# Patient Record
Sex: Female | Born: 1980 | Race: Black or African American | Hispanic: No | Marital: Single | Smoking: Never smoker
Health system: Southern US, Community
[De-identification: ages and names within clinical notes are randomized; demographics above are authoritative.]

---

## 2010-06-03 NOTE — L&D Delivery Note (Signed)
Delivery Note At 3:21 PM a viable female was delivered via Vaginal, Spontaneous Delivery (Presentation: ;  ).  APGAR: 9, 9; weight .   Placenta status: Intact, Spontaneous.  Cord: 3 vessels with the following complications: .  Cord pH: not done  Anesthesia: None  Episiotomy: None Lacerations: 2nd degree Suture Repair: 2.0 vicryl Est. Blood Loss (mL): 250  Mom to postpartum.  Baby to nursery-stable.  MARSHALL,BERNARD A 06/01/2011, 3:53 PM

## 2011-06-01 ENCOUNTER — Encounter (HOSPITAL_COMMUNITY): Payer: Self-pay | Admitting: *Deleted

## 2011-06-01 ENCOUNTER — Inpatient Hospital Stay (HOSPITAL_COMMUNITY)
Admission: AD | Admit: 2011-06-01 | Discharge: 2011-06-03 | DRG: 775 | Disposition: A | Discharge status: Home / Self Care | Payer: Medicaid Other | Source: Ambulatory Visit | Attending: Obstetrics | Admitting: Obstetrics

## 2011-06-01 MED ORDER — DIPHENHYDRAMINE HCL 25 MG PO CAPS: 25.0000 mg | ORAL_CAPSULE | Freq: Four times a day (QID) | ORAL | Status: DC | PRN

## 2011-06-01 MED ORDER — ERYTHROMYCIN 5 MG/GM OP OINT
TOPICAL_OINTMENT | OPHTHALMIC | Status: AC
  Filled 2011-06-01: qty 1

## 2011-06-01 MED ORDER — LANOLIN HYDROUS EX OINT: TOPICAL_OINTMENT | CUTANEOUS | Status: DC | PRN

## 2011-06-01 MED ORDER — TETANUS-DIPHTH-ACELL PERTUSSIS 5-2.5-18.5 LF-MCG/0.5 IM SUSP
0.5000 mL | Freq: Once | INTRAMUSCULAR | Status: AC
  Administered 2011-06-03: 0.5 mL via INTRAMUSCULAR
  Filled 2011-06-01: qty 0.5

## 2011-06-01 MED ORDER — IBUPROFEN 600 MG PO TABS
600.0000 mg | ORAL_TABLET | Freq: Four times a day (QID) | ORAL | Status: DC
  Administered 2011-06-01 – 2011-06-03 (×7): 600 mg via ORAL
  Filled 2011-06-01 (×7): qty 1

## 2011-06-01 MED ORDER — ONDANSETRON HCL 4 MG PO TABS: 4.0000 mg | ORAL_TABLET | ORAL | Status: DC | PRN

## 2011-06-01 MED ORDER — WITCH HAZEL-GLYCERIN EX PADS: 1.0000 "application " | MEDICATED_PAD | CUTANEOUS | Status: DC | PRN

## 2011-06-01 MED ORDER — LIDOCAINE HCL (PF) 1 % IJ SOLN
INTRAMUSCULAR | Status: AC
  Filled 2011-06-01: qty 30

## 2011-06-01 MED ORDER — PRENATAL MULTIVITAMIN CH
1.0000 | ORAL_TABLET | Freq: Every day | ORAL | Status: DC
  Administered 2011-06-02 – 2011-06-03 (×2): 1 via ORAL
  Filled 2011-06-01 (×2): qty 1

## 2011-06-01 MED ORDER — SENNOSIDES-DOCUSATE SODIUM 8.6-50 MG PO TABS
2.0000 | ORAL_TABLET | Freq: Every day | ORAL | Status: DC
  Administered 2011-06-01 – 2011-06-02 (×2): 2 via ORAL

## 2011-06-01 MED ORDER — BENZOCAINE-MENTHOL 20-0.5 % EX AERO: 1.0000 "application " | INHALATION_SPRAY | CUTANEOUS | Status: DC | PRN

## 2011-06-01 MED ORDER — FERROUS SULFATE 325 (65 FE) MG PO TABS
325.0000 mg | ORAL_TABLET | Freq: Two times a day (BID) | ORAL | Status: DC
  Administered 2011-06-02 – 2011-06-03 (×3): 325 mg via ORAL
  Filled 2011-06-01 (×3): qty 1

## 2011-06-01 MED ORDER — DIBUCAINE 1 % RE OINT: 1.0000 "application " | TOPICAL_OINTMENT | RECTAL | Status: DC | PRN

## 2011-06-01 MED ORDER — ZOLPIDEM TARTRATE 5 MG PO TABS: 5.0000 mg | ORAL_TABLET | Freq: Every evening | ORAL | Status: DC | PRN

## 2011-06-01 MED ORDER — OXYCODONE-ACETAMINOPHEN 5-325 MG PO TABS
1.0000 | ORAL_TABLET | ORAL | Status: DC | PRN
  Administered 2011-06-01: 1 via ORAL
  Administered 2011-06-01 – 2011-06-03 (×4): 2 via ORAL
  Filled 2011-06-01 (×2): qty 2
  Filled 2011-06-01: qty 1
  Filled 2011-06-01: qty 2
  Filled 2011-06-01: qty 1
  Filled 2011-06-01: qty 2

## 2011-06-01 MED ORDER — ONDANSETRON HCL 4 MG/2ML IJ SOLN: 4.0000 mg | INTRAMUSCULAR | Status: DC | PRN

## 2011-06-01 MED ORDER — SIMETHICONE 80 MG PO CHEW: 80.0000 mg | CHEWABLE_TABLET | ORAL | Status: DC | PRN

## 2011-06-01 MED ORDER — OXYTOCIN 10 UNIT/ML IJ SOLN
INTRAMUSCULAR | Status: AC
  Administered 2011-06-01: 20 [IU]
  Filled 2011-06-01: qty 2

## 2011-06-01 NOTE — H&P (Signed)
This is Dr. 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

## 2011-06-01 NOTE — Progress Notes (Signed)
Pt very uncomforatable. 3 baby. Thinks water broke. Placed on monitor  SVE complete+1

## 2011-06-02 ENCOUNTER — Encounter (HOSPITAL_COMMUNITY): Payer: Self-pay | Admitting: *Deleted

## 2011-06-02 LAB — CBC
HCT: 30.8 % — ABNORMAL LOW (ref 36.0–46.0)
MCV: 83.2 fL (ref 78.0–100.0)
Platelets: 107 10*3/uL — ABNORMAL LOW (ref 150–400)
RBC: 3.7 MIL/uL — ABNORMAL LOW (ref 3.87–5.11)
WBC: 11.4 10*3/uL — ABNORMAL HIGH (ref 4.0–10.5)

## 2011-06-02 NOTE — Progress Notes (Signed)
Patient ID: Jeanne Willis, female   DOB: [birth year], 30 y.o.   MRN: [id number] Postpartum day one Vital signs normal Fundus firm Lochia moderate Legs negative No complaints

## 2011-06-03 MED ORDER — INFLUENZA VIRUS VACC SPLIT PF IM SUSP
0.5000 mL | INTRAMUSCULAR | Status: AC | PRN
  Administered 2011-06-03: 0.5 mL via INTRAMUSCULAR
  Filled 2011-06-03: qty 0.5

## 2011-06-03 NOTE — Discharge Summary (Signed)
Obstetric Discharge Summary Reason for Admission: onset of labor Prenatal Procedures: none Intrapartum Procedures: spontaneous vaginal delivery Postpartum Procedures: none Complications-Operative and Postpartum: none Hemoglobin  Date Value Range Status  06/02/2011 10.3* 12.0-15.0 (g/dL) Final     HCT  Date Value Range Status  06/02/2011 30.8* 36.0-46.0 (%) Final    Discharge Diagnoses: Term Pregnancy-delivered  Discharge Information: Date: 06/03/2011 Activity: pelvic rest Diet: routine Medications: Percocet Condition: stable Instructions: refer to practice specific booklet Discharge to: home Follow-up Information    Follow up with MARSHALL,BERNARD A, MD. Call in 6 weeks.   Contact information:   [address] [place] [address] [phone]          Newborn Data: Live born female  Birth Weight: 8 lb 11 oz (3940 g) APGAR: 9, 9  Home with mother.  MARSHALL,BERNARD A 06/03/2011, 6:37 AM

## 2011-06-03 NOTE — Progress Notes (Signed)
UR chart review completed.  

## 2011-08-07 ENCOUNTER — Ambulatory Visit
Admission: RE | Admit: 2011-08-07 | Discharge: 2011-08-07 | Disposition: A | Discharge status: Home / Self Care | Payer: Medicaid Other | Source: Ambulatory Visit | Attending: Specialist | Admitting: Specialist

## 2011-08-07 ENCOUNTER — Other Ambulatory Visit: Payer: Self-pay | Admitting: Specialist

## 2011-08-07 DIAGNOSIS — R7611 Nonspecific reaction to tuberculin skin test without active tuberculosis: Secondary | ICD-10-CM

## 2014-04-04 ENCOUNTER — Encounter (HOSPITAL_COMMUNITY): Payer: Self-pay | Admitting: *Deleted

## 2017-07-02 ENCOUNTER — Emergency Department (HOSPITAL_COMMUNITY): Payer: Medicaid Other

## 2017-07-02 ENCOUNTER — Other Ambulatory Visit: Payer: Self-pay

## 2017-07-02 ENCOUNTER — Emergency Department (HOSPITAL_COMMUNITY)
Admission: EM | Admit: 2017-07-02 | Discharge: 2017-07-02 | Disposition: A | Discharge status: Home / Self Care | Payer: Medicaid Other | Attending: Physician Assistant | Admitting: Physician Assistant

## 2017-07-02 DIAGNOSIS — W25XXXA Contact with sharp glass, initial encounter: Secondary | ICD-10-CM | POA: Diagnosis not present

## 2017-07-02 DIAGNOSIS — Z23 Encounter for immunization: Secondary | ICD-10-CM | POA: Insufficient documentation

## 2017-07-02 DIAGNOSIS — S61411A Laceration without foreign body of right hand, initial encounter: Secondary | ICD-10-CM | POA: Diagnosis not present

## 2017-07-02 DIAGNOSIS — Y92000 Kitchen of unspecified non-institutional (private) residence as  the place of occurrence of the external cause: Secondary | ICD-10-CM | POA: Insufficient documentation

## 2017-07-02 DIAGNOSIS — Y998 Other external cause status: Secondary | ICD-10-CM | POA: Insufficient documentation

## 2017-07-02 DIAGNOSIS — S6991XA Unspecified injury of right wrist, hand and finger(s), initial encounter: Secondary | ICD-10-CM | POA: Diagnosis present

## 2017-07-02 DIAGNOSIS — Y93G1 Activity, food preparation and clean up: Secondary | ICD-10-CM | POA: Diagnosis not present

## 2017-07-02 MED ORDER — OXYCODONE-ACETAMINOPHEN 5-325 MG PO TABS
1.0000 | ORAL_TABLET | Freq: Once | ORAL | Status: AC
  Administered 2017-07-02: 1 via ORAL
  Filled 2017-07-02: qty 1

## 2017-07-02 MED ORDER — LIDOCAINE HCL (PF) 1 % IJ SOLN
5.0000 mL | Freq: Once | INTRAMUSCULAR | Status: AC
  Administered 2017-07-02: 5 mL via INTRADERMAL
  Filled 2017-07-02: qty 5

## 2017-07-02 MED ORDER — TETANUS-DIPHTH-ACELL PERTUSSIS 5-2.5-18.5 LF-MCG/0.5 IM SUSP
0.5000 mL | Freq: Once | INTRAMUSCULAR | Status: AC
  Administered 2017-07-02: 0.5 mL via INTRAMUSCULAR
  Filled 2017-07-02: qty 0.5

## 2017-07-02 NOTE — ED Notes (Signed)
Will be discharged right after splint applied.

## 2017-07-02 NOTE — Discharge Instructions (Signed)
He was seen with laceration to her right hand.  It was very extensive.  We were able to repair it.  But we want you to wear the splint to help that heal.  Please remove sutures in 10-14 days.  You may need to follow-up with a hand surgeon if you have any weakness or any symptoms such as an infection or other concerns.

## 2017-07-02 NOTE — ED Triage Notes (Addendum)
Pt was walking outside caring a plate and slipped on ice and cut her right hand right under right thumb on the thumb pad of palm.Pt has deep laceration , controlled bleeding at this time. Pt able to move all digits.

## 2017-07-02 NOTE — ED Notes (Signed)
Ortho tech paged to placed splint on right hand to allow sutures to stay in place.

## 2017-07-02 NOTE — ED Provider Notes (Signed)
[facility] Provider Note   CSN: [id number] Arrival date & time: 07/02/17  [id number]     History   Chief Complaint No chief complaint on file.   HPI Jeanne Willis is a 37 y.o. female.  HPI   Patient is a 37 year old female presenting after laceration to her right hand.  Patient reports that she was washing dishes she had a plate in her hand she fell onto the floor and the plate caught her right hand.  Patient has a laceration sustained to the thenar eminence.  No past medical history on file.  There are no active problems to display for this patient.     OB History    Gravida Para Term Preterm AB Living   2 1       1    SAB TAB Ectopic Multiple Live Births           1       Home Medications    Prior to Admission medications   Not on File    Family History No family history on file.  Social History Social History   Tobacco Use  . Smoking status: Not on file  Substance Use Topics  . Alcohol use: Not on file  . Drug use: Not on file     Allergies   Patient has no known allergies.   Review of Systems Review of Systems  Constitutional: Negative for activity change.  Respiratory: Negative for shortness of breath.   Cardiovascular: Negative for chest pain.  Gastrointestinal: Negative for abdominal pain.  All other systems reviewed and are negative.    Physical Exam Updated Vital Signs BP 121/84 (BP Location: Left Arm)   Pulse 75   Temp 98.6 F (37 C) (Oral)   Resp 16   SpO2 100%   Physical Exam  Constitutional: She is oriented to person, place, and time. She appears well-developed and well-nourished.  HENT:  Head: Normocephalic and atraumatic.  Eyes: Right eye exhibits no discharge.  Cardiovascular: Normal rate, regular rhythm and normal heart sounds.  No murmur heard. Pulmonary/Chest: Effort normal.  Musculoskeletal:  Laceration to right hand. >6 cm to thenar emenence.  Thumb strnegth normal, Thumb  to pinky strength normal.   Neurological: She is oriented to person, place, and time.  Skin: Skin is warm and dry. She is not diaphoretic.  Psychiatric: She has a normal mood and affect.  Nursing note and vitals reviewed.    ED Treatments / Results  Labs (all labs ordered are listed, but only abnormal results are displayed) Labs Reviewed - No data to display  EKG  EKG Interpretation None       Radiology No results found.  Procedures .Marland Kitchen.Laceration Repair Date/Time: 07/02/2017 10:41 AM Performed by: Abelino DerrickMackuen, Tavaria Mackins Lyn, MD Authorized by: Abelino DerrickMackuen, Fusako Tanabe Lyn, MD   Consent:    Consent obtained:  Verbal Anesthesia (see MAR for exact dosages):    Anesthesia method:  Local infiltration   Local anesthetic:  Lidocaine 1% WITH epi Laceration details:    Location:  Hand   Hand location:  R palm   Length (cm):  8   Laceration depth: 20. Repair type:    Repair type:  Complex Pre-procedure details:    Preparation:  Patient was prepped and draped in usual sterile fashion Exploration:    Limited defect created (wound extended): no     Wound extent: muscle damage     Wound extent: no nerve damage noted, no tendon damage noted, no  underlying fracture noted and no vascular damage noted     Contaminated: no   Treatment:    Area cleansed with:  Betadine   Amount of cleaning:  Extensive   Irrigation solution:  Sterile saline   Irrigation method:  Pressure wash and syringe   Visualized foreign bodies/material removed: no     Debridement:  None Subcutaneous repair:    Suture size:  4-0   Suture material:  Vicryl   Suture technique:  Simple interrupted   Number of sutures:  4 Skin repair:    Repair method:  Sutures   Suture size:  3-0   Suture material:  Nylon   Suture technique:  Simple interrupted   Number of sutures:  5 Approximation:    Approximation:  Close   Vermilion border: well-aligned   Post-procedure details:    Dressing:  Antibiotic ointment, bulky  dressing and splint for protection   Patient tolerance of procedure:  Tolerated well, no immediate complications   (including critical care time)  SPLINT APPLICATION Date/Time: 10:43 AM Authorized by: Arlana Hove Consent: Verbal consent obtained. Risks and benefits: risks, benefits and alternatives were discussed Consent given by: patient Splint applied ZO:[id number] technician Location details: r wrist Post-procedure: The splinted body part was neurovascularly unchanged following the procedure. Patient tolerance: Patient tolerated the procedure well with no immediate complications.      Medications Ordered in ED Medications  Tdap (BOOSTRIX) injection 0.5 mL (not administered)  oxyCODONE-acetaminophen (PERCOCET/ROXICET) 5-325 MG per tablet 1 tablet (not administered)  lidocaine (PF) (XYLOCAINE) 1 % injection 5 mL (not administered)     Initial Impression / Assessment and Plan / ED Course  I have reviewed the triage vital signs and the nursing notes.  Pertinent labs & imaging results that were available during my care of the patient were reviewed by me and considered in my medical decision making (see chart for details).     Patient is a 37 year old female presenting after laceration to her right hand.  Patient reports that she was washing dishes she had a plate in her hand she fell onto the floor and the plate caught her right hand.  Patient has a laceration sustained to the thenar eminence.   9:09 AM Will get x-ray to rule out retained piece of glass.  Will update tetanus.  Plan on appearing and will splint patient to maximize healing.  Laceration sutured after extensive cleaning.  Splint applied  Final Clinical Impressions(s) / ED Diagnoses   Final diagnoses:  None    ED Discharge Orders    None       Mccall Will, Cindee Salt, MD 07/02/17 1044

## 2017-07-02 NOTE — Progress Notes (Signed)
Orthopedic Tech Progress Note Patient Details:  [name] 1981/01/27 [id number]  Ortho Devices Type of Ortho Device: Ace wrap, Lenora BoysWatson Jones splint Ortho Device/Splint Location: rue Ortho Device/Splint Interventions: Application   Post Interventions Patient Tolerated: Well Instructions Provided: Care of device   [name] 07/02/2017, 11:08 AM

## 2017-07-15 ENCOUNTER — Emergency Department (HOSPITAL_COMMUNITY)
Admission: EM | Admit: 2017-07-15 | Discharge: 2017-07-15 | Disposition: A | Discharge status: Home / Self Care | Payer: Medicaid Other | Attending: Emergency Medicine | Admitting: Emergency Medicine

## 2017-07-15 ENCOUNTER — Other Ambulatory Visit: Payer: Self-pay

## 2017-07-15 DIAGNOSIS — Z4802 Encounter for removal of sutures: Secondary | ICD-10-CM

## 2017-07-15 DIAGNOSIS — S61411D Laceration without foreign body of right hand, subsequent encounter: Secondary | ICD-10-CM | POA: Diagnosis not present

## 2017-07-15 DIAGNOSIS — Y33XXXD Other specified events, undetermined intent, subsequent encounter: Secondary | ICD-10-CM | POA: Diagnosis not present

## 2017-07-15 NOTE — ED Provider Notes (Signed)
[facility]   CSN: [id number] Arrival date & time: 07/15/17  1140     History   Chief Complaint Chief Complaint  Patient presents with  . Suture / Staple Removal    HPI Jeanne Willis is a 37 y.o. female.  HPI   37 year old female presents today for suture removal.  She suffered a laceration to the right thenar eminence on 07/02/2017.  She notes she is here for suture removal, denies any redness swelling or discharge, but no significant complications.  No past medical history on file.  There are no active problems to display for this patient.    OB History    Gravida Para Term Preterm AB Living   2 1       1    SAB TAB Ectopic Multiple Live Births           1      Home Medications    Prior to Admission medications   Not on File    Family History No family history on file.  Social History Social History   Tobacco Use  . Smoking status: Not on file  Substance Use Topics  . Alcohol use: Not on file  . Drug use: Not on file     Allergies   Patient has no known allergies.   Review of Systems Review of Systems  All other systems reviewed and are negative.    Physical Exam Updated Vital Signs BP 120/80 (BP Location: Right Arm)   Pulse 71   Temp 98.5 F (36.9 C) (Oral)   Resp 16   SpO2 100%   Physical Exam  Constitutional: She is oriented to person, place, and time. She appears well-developed and well-nourished.  HENT:  Head: Normocephalic and atraumatic.  Eyes: Conjunctivae are normal. Pupils are equal, round, and reactive to light. Right eye exhibits no discharge. Left eye exhibits no discharge. No scleral icterus.  Neck: Normal range of motion. No JVD present. No tracheal deviation present.  Pulmonary/Chest: Effort normal. No stridor.  Musculoskeletal:  Healed 6 inch laceration to the right thenar eminence no surrounding redness discharge or swelling  Neurological: She is alert and  oriented to person, place, and time. Coordination normal.  Psychiatric: She has a normal mood and affect. Her behavior is normal. Judgment and thought content normal.  Nursing note and vitals reviewed.    ED Treatments / Results  Labs (all labs ordered are listed, but only abnormal results are displayed) Labs Reviewed - No data to display  EKG  EKG Interpretation None       Radiology No results found.  Procedures .Suture Removal Date/Time: 07/15/2017 12:06 PM Performed by: Eyvonne MechanicHedges, Benjamim Harnish, PA-C Authorized by: Eyvonne MechanicHedges, Winifred Bodiford, PA-C   Consent:    Consent obtained:  Verbal   Consent given by:  Patient   Risks discussed:  Pain and bleeding   Alternatives discussed:  Delayed treatment, no treatment and alternative treatment Location:    Location:  Upper extremity   Upper extremity location:  Hand   Hand location:  R hand Procedure details:    Wound appearance:  No signs of infection, clean and good wound healing   Number of sutures removed:  5 Post-procedure details:    Post-removal:  Band-Aid applied   Patient tolerance of procedure:  Tolerated well, no immediate complications   (including critical care time)    Medications Ordered in ED Medications - No data to display   Initial Impression / Assessment and  Plan / ED Course  I have reviewed the triage vital signs and the nursing notes.  Pertinent labs & imaging results that were available during my care of the patient were reviewed by me and considered in my medical decision making (see chart for details).     Final Clinical Impressions(s) / ED Diagnoses   Final diagnoses:  Visit for suture removal    Labs:   Imaging:  Consults:  Therapeutics:  Discharge Meds:   Assessment/Plan: Patient here for suture removal no complications return precautions given.    ED Discharge Orders    None       Rosalio Loud 07/15/17 1209    Gwyneth Sprout, MD 07/15/17 [phone]

## 2017-07-15 NOTE — ED Notes (Signed)
Declined W/C at D/C and was escorted to lobby by RN. 

## 2017-07-15 NOTE — ED Triage Notes (Signed)
Pt here to have sutures on right hand removed.

## 2017-07-15 NOTE — Discharge Instructions (Signed)
Please read attached information. If you experience any new or worsening signs or symptoms please return to the emergency room for evaluation.  °

## 2021-05-13 ENCOUNTER — Other Ambulatory Visit: Payer: Self-pay

## 2021-05-13 ENCOUNTER — Emergency Department (HOSPITAL_COMMUNITY): Payer: Medicaid Other

## 2021-05-13 ENCOUNTER — Emergency Department (HOSPITAL_COMMUNITY)
Admission: EM | Admit: 2021-05-13 | Discharge: 2021-05-13 | Disposition: A | Discharge status: Home / Self Care | Payer: Medicaid Other | Attending: Emergency Medicine | Admitting: Emergency Medicine

## 2021-05-13 ENCOUNTER — Encounter (HOSPITAL_COMMUNITY): Payer: Self-pay | Admitting: Emergency Medicine

## 2021-05-13 DIAGNOSIS — B349 Viral infection, unspecified: Secondary | ICD-10-CM

## 2021-05-13 DIAGNOSIS — R1031 Right lower quadrant pain: Secondary | ICD-10-CM | POA: Insufficient documentation

## 2021-05-13 DIAGNOSIS — Z20822 Contact with and (suspected) exposure to covid-19: Secondary | ICD-10-CM | POA: Diagnosis not present

## 2021-05-13 DIAGNOSIS — R1030 Lower abdominal pain, unspecified: Secondary | ICD-10-CM

## 2021-05-13 DIAGNOSIS — R110 Nausea: Secondary | ICD-10-CM | POA: Diagnosis not present

## 2021-05-13 DIAGNOSIS — R197 Diarrhea, unspecified: Secondary | ICD-10-CM | POA: Insufficient documentation

## 2021-05-13 LAB — CBC WITH DIFFERENTIAL/PLATELET
Abs Immature Granulocytes: 0.01 10*3/uL (ref 0.00–0.07)
Basophils Absolute: 0 10*3/uL (ref 0.0–0.1)
Basophils Relative: 1 %
Eosinophils Absolute: 0 10*3/uL (ref 0.0–0.5)
Eosinophils Relative: 1 %
HCT: 43 % (ref 36.0–46.0)
Hemoglobin: 14 g/dL (ref 12.0–15.0)
Immature Granulocytes: 0 %
Lymphocytes Relative: 47 %
Lymphs Abs: 2 10*3/uL (ref 0.7–4.0)
MCH: 28.6 pg (ref 26.0–34.0)
MCHC: 32.6 g/dL (ref 30.0–36.0)
MCV: 87.8 fL (ref 80.0–100.0)
Monocytes Absolute: 0.3 10*3/uL (ref 0.1–1.0)
Monocytes Relative: 7 %
Neutro Abs: 1.8 10*3/uL (ref 1.7–7.7)
Neutrophils Relative %: 44 %
Platelets: 166 10*3/uL (ref 150–400)
RBC: 4.9 MIL/uL (ref 3.87–5.11)
RDW: 12.4 % (ref 11.5–15.5)
WBC: 4.2 10*3/uL (ref 4.0–10.5)
nRBC: 0 % (ref 0.0–0.2)

## 2021-05-13 LAB — RESP PANEL BY RT-PCR (FLU A&B, COVID) ARPGX2
Influenza A by PCR: NEGATIVE
Influenza B by PCR: NEGATIVE
SARS Coronavirus 2 by RT PCR: NEGATIVE

## 2021-05-13 LAB — COMPREHENSIVE METABOLIC PANEL
ALT: 18 U/L (ref 0–44)
AST: 21 U/L (ref 15–41)
Albumin: 3.6 g/dL (ref 3.5–5.0)
Alkaline Phosphatase: 52 U/L (ref 38–126)
Anion gap: 5 (ref 5–15)
BUN: 6 mg/dL (ref 6–20)
CO2: 25 mmol/L (ref 22–32)
Calcium: 9 mg/dL (ref 8.9–10.3)
Chloride: 105 mmol/L (ref 98–111)
Creatinine, Ser: 0.8 mg/dL (ref 0.44–1.00)
GFR, Estimated: >60 mL/min (ref >60)
Glucose, Bld: 91 mg/dL (ref 70–99)
Potassium: 4 mmol/L (ref 3.5–5.1)
Sodium: 135 mmol/L (ref 135–145)
Total Bilirubin: 0.7 mg/dL (ref 0.3–1.2)
Total Protein: 7 g/dL (ref 6.5–8.1)

## 2021-05-13 LAB — I-STAT BETA HCG BLOOD, ED (MC, WL, AP ONLY): I-stat hCG, quantitative: <5 m[IU]/mL (ref <5)

## 2021-05-13 LAB — URINALYSIS, ROUTINE W REFLEX MICROSCOPIC
Bilirubin Urine: NEGATIVE
Glucose, UA: NEGATIVE mg/dL
Hgb urine dipstick: NEGATIVE
Ketones, ur: NEGATIVE mg/dL
Leukocytes,Ua: NEGATIVE
Nitrite: NEGATIVE
Protein, ur: NEGATIVE mg/dL
Specific Gravity, Urine: 1.018 (ref 1.005–1.030)
pH: 7 (ref 5.0–8.0)

## 2021-05-13 LAB — GROUP A STREP BY PCR: Group A Strep by PCR: NOT DETECTED

## 2021-05-13 LAB — LIPASE, BLOOD: Lipase: 27 U/L (ref 11–51)

## 2021-05-13 MED ORDER — ONDANSETRON HCL 4 MG/2ML IJ SOLN
4.0000 mg | Freq: Once | INTRAMUSCULAR | Status: AC
  Administered 2021-05-13: 4 mg via INTRAVENOUS
  Filled 2021-05-13: qty 2

## 2021-05-13 MED ORDER — IOHEXOL 300 MG/ML  SOLN
100.0000 mL | Freq: Once | INTRAMUSCULAR | Status: AC | PRN
  Administered 2021-05-13: 100 mL via INTRAVENOUS

## 2021-05-13 MED ORDER — DICYCLOMINE HCL 20 MG PO TABS: 20.0000 mg | ORAL_TABLET | Freq: Two times a day (BID) | ORAL | 0 refills | Status: AC | PRN

## 2021-05-13 MED ORDER — ONDANSETRON 4 MG PO TBDP: 4.0000 mg | ORAL_TABLET | Freq: Three times a day (TID) | ORAL | 0 refills | Status: AC | PRN

## 2021-05-13 NOTE — ED Provider Notes (Signed)
[facility] EMERGENCY DEPARTMENT Provider Note  CSN: [id number] Arrival date & time: 05/13/21 1141    History No chief complaint on file.   Blyss Lugar is a 40 y.o. female reports two days of abdominal cramping, nausea without vomiting, occasional diarrhea, sore throat and chills but no fever. She denies any urinary symptoms.    History reviewed. No pertinent past medical history.  History reviewed. No pertinent surgical history.  No family history on file.  Social History   Tobacco Use  . Smoking status: Never  . Smokeless tobacco: Never  Substance Use Topics  . Alcohol use: Not Currently  . Drug use: Not Currently     Home Medications Prior to Admission medications   Not on File     Allergies    Patient has no known allergies.   Review of Systems   Review of Systems A comprehensive review of systems was completed and negative except as noted in HPI.    Physical Exam BP 131/90   Pulse 67   Temp 97.9 F (36.6 C) (Oral)   Resp 16   LMP 05/03/2021   SpO2 100%   Physical Exam Vitals and nursing note reviewed.  Constitutional:      Appearance: Normal appearance.  HENT:     Head: Normocephalic and atraumatic.     Nose: Nose normal.     Mouth/Throat:     Mouth: Mucous membranes are moist.  Eyes:     Extraocular Movements: Extraocular movements intact.     Conjunctiva/sclera: Conjunctivae normal.  Cardiovascular:     Rate and Rhythm: Normal rate.  Pulmonary:     Effort: Pulmonary effort is normal.     Breath sounds: Normal breath sounds.  Abdominal:     General: Abdomen is flat.     Palpations: Abdomen is soft.     Tenderness: There is abdominal tenderness (RLQ). There is no guarding or rebound.  Musculoskeletal:        General: No swelling. Normal range of motion.     Cervical back: Neck supple.  Skin:    General: Skin is warm and dry.  Neurological:     General: No focal deficit present.     Mental Status: She is alert.  Psychiatric:         Mood and Affect: Mood normal.     ED Results / Procedures / Treatments   Labs (all labs ordered are listed, but only abnormal results are displayed) Labs Reviewed  GROUP A STREP BY PCR  RESP PANEL BY RT-PCR (FLU A&B, COVID) ARPGX2  URINALYSIS, ROUTINE W REFLEX MICROSCOPIC  COMPREHENSIVE METABOLIC PANEL  LIPASE, BLOOD  CBC WITH DIFFERENTIAL/PLATELET  I-STAT BETA HCG BLOOD, ED ([facility], [facility], [facility] ONLY)    EKG None  Radiology No results found.  Procedures Procedures  Medications Ordered in the ED Medications - No data to display   MDM Rules/Calculators/A&P MDM Patient here with nonspecific abdominal pains, but focally tender in RLQ. No peritoneal signs now. Will check CT. Labs done in triage are otherwise normal including CBC, CMP, strep, Covid tests. Zofran for nausea.   ED Course  I have reviewed the triage vital signs and the nursing notes.  Pertinent labs & imaging results that were available during my care of the patient were reviewed by me and considered in my medical decision making (see chart for details).  Clinical Course as of 05/13/21 1947  Wynelle Link May 13, 2021  1802 CT images and results reviewed, no definite appendicitis, small  amount of fluid at the tip of the cecum is nonspecific. Given normal WBC and no peritoneal signs will plan discharge with Rx for zofran for nausea and strict return precautions if pain worsens.  [CS]    Clinical Course User Index [CS] Pollyann Savoy, MD    Final Clinical Impression(s) / ED Diagnoses Final diagnoses:  None    Rx / DC Orders ED Discharge Orders     None        Pollyann Savoy, MD 05/13/21 1947

## 2021-05-13 NOTE — ED Provider Notes (Signed)
Emergency Medicine Provider Triage Evaluation Note  [facility] , a 40 y.o. female  was evaluated in triage.  Pt complains of abdominal pain, nausea and sore throat.  She states that symptoms began yesterday with abdominal cramping.  She states that she feels uneasy on her stomach and nauseated however has not had any vomiting.  She does endorse diarrhea.  She denies fevers.  She states that additionally she is having pain with swallowing and cold chills.  Denies sick contacts.  Review of Systems  Positive: See above Negative:   Physical Exam  BP 131/90   Pulse 67   Temp 97.9 F (36.6 C) (Oral)   Resp 16   SpO2 100%  Gen:   Awake, no distress   Resp:  Normal effort MSK:   Moves extremities without difficulty  Other:  Abdomen is rounded, soft and nontender.  Oropharynx with erythema, bilateral tonsils 2+.  Medical Decision Making  Medically screening exam initiated at 12:24 PM.  Appropriate orders placed.  [facility] was informed that the remainder of the evaluation will be completed by another provider, this initial triage assessment does not replace that evaluation, and the importance of remaining in the ED until their evaluation is complete.     Cristopher Peru, PA-C 05/13/21 1225    Pollyann Savoy, MD 05/13/21 1356

## 2021-05-13 NOTE — ED Triage Notes (Signed)
Pt reports abd pain, nausea, diarrhea, and sore throat since yesterday.  Denies vomiting.  Denies fever.

## 2021-05-20 ENCOUNTER — Emergency Department (HOSPITAL_COMMUNITY)
Admission: EM | Admit: 2021-05-20 | Discharge: 2021-05-20 | Disposition: A | Discharge status: Home / Self Care | Payer: Medicaid Other | Attending: Emergency Medicine | Admitting: Emergency Medicine

## 2021-05-20 ENCOUNTER — Encounter (HOSPITAL_COMMUNITY): Payer: Self-pay

## 2021-05-20 DIAGNOSIS — R21 Rash and other nonspecific skin eruption: Secondary | ICD-10-CM | POA: Diagnosis present

## 2021-05-20 DIAGNOSIS — L509 Urticaria, unspecified: Secondary | ICD-10-CM | POA: Diagnosis not present

## 2021-05-20 MED ORDER — PREDNISONE 20 MG PO TABS
50.0000 mg | ORAL_TABLET | Freq: Once | ORAL | Status: AC
  Administered 2021-05-20: 04:00:00 50 mg via ORAL
  Filled 2021-05-20: qty 3

## 2021-05-20 MED ORDER — FAMOTIDINE 20 MG PO TABS: 20.0000 mg | ORAL_TABLET | Freq: Every day | ORAL | 0 refills | Status: AC

## 2021-05-20 MED ORDER — DIPHENHYDRAMINE HCL 25 MG PO TABS: 25.0000 mg | ORAL_TABLET | Freq: Four times a day (QID) | ORAL | 0 refills | Status: AC | PRN

## 2021-05-20 MED ORDER — PREDNISONE 20 MG PO TABS: 40.0000 mg | ORAL_TABLET | Freq: Every day | ORAL | 0 refills | Status: AC

## 2021-05-20 MED ORDER — FAMOTIDINE 20 MG PO TABS
20.0000 mg | ORAL_TABLET | Freq: Once | ORAL | Status: AC
  Administered 2021-05-20: 04:00:00 20 mg via ORAL
  Filled 2021-05-20: qty 1

## 2021-05-20 MED ORDER — DIPHENHYDRAMINE HCL 25 MG PO CAPS
50.0000 mg | ORAL_CAPSULE | Freq: Once | ORAL | Status: AC
  Administered 2021-05-20: 04:00:00 50 mg via ORAL
  Filled 2021-05-20: qty 2

## 2021-05-20 MED ORDER — EPINEPHRINE 0.3 MG/0.3ML IJ SOAJ: 0.3000 mg | INTRAMUSCULAR | 0 refills | Status: AC | PRN

## 2021-05-20 NOTE — ED Provider Notes (Signed)
[facility] EMERGENCY DEPARTMENT Provider Note   CSN: [id number] Arrival date & time: 05/20/21  0108     History Chief Complaint  Patient presents with   Rash    Jeanne Willis is a 40 y.o. female.  HPI     This a 40 year old female with no reported past medical history who presents with a rash.  Patient reports an itchy rash over her entire body.  Onset of symptoms yesterday afternoon.  She denies any new foods, medications, soaps, detergents, lotions.  Denies throat swelling or shortness of breath.  She is not taking anything for her symptoms.  She states that the itchiness makes her very uncomfortable.  She never had a thing like this before.  Denies nausea, vomiting, shortness of breath, chest pain.  History reviewed. No pertinent past medical history.  There are no problems to display for this patient.   History reviewed. No pertinent surgical history.   OB History     Gravida  2   Para  1   Term      Preterm      AB      Living  1      SAB      IAB      Ectopic      Multiple      Live Births  1           No family history on file.  Social History   Tobacco Use   Smoking status: Never   Smokeless tobacco: Never  Substance Use Topics   Alcohol use: Not Currently   Drug use: Not Currently    Home Medications Prior to Admission medications   Medication Sig Start Date End Date Taking? Authorizing Provider  diphenhydrAMINE (BENADRYL) 25 MG tablet Take 1 tablet (25 mg total) by mouth every 6 (six) hours as needed. 05/20/21  Yes Fran Mcree, Mayer Masker, MD  famotidine (PEPCID) 20 MG tablet Take 1 tablet (20 mg total) by mouth daily. 05/20/21  Yes Emit Kuenzel, Mayer Masker, MD  predniSONE (DELTASONE) 20 MG tablet Take 2 tablets (40 mg total) by mouth daily. 05/20/21  Yes Neythan Kozlov, Mayer Masker, MD  dicyclomine (BENTYL) 20 MG tablet Take 1 tablet (20 mg total) by mouth 2 (two) times daily as needed (stomach pains). 05/13/21   Pollyann Savoy, MD  ibuprofen (ADVIL) 400 MG tablet Take 400 mg by mouth every 6 (six) hours as needed for moderate pain.    [provider]  ondansetron (ZOFRAN-ODT) 4 MG disintegrating tablet Take 1 tablet (4 mg total) by mouth every 8 (eight) hours as needed for nausea or vomiting. 05/13/21   Pollyann Savoy, MD    Allergies    Patient has no known allergies.  Review of Systems   Review of Systems  Constitutional:  Negative for fever.  HENT:  Negative for congestion and trouble swallowing.   Respiratory:  Negative for cough and shortness of breath.   Cardiovascular:  Negative for chest pain.  Gastrointestinal:  Negative for abdominal pain, nausea and vomiting.  Skin:  Positive for rash.  All other systems reviewed and are negative.  Physical Exam Updated Vital Signs BP 113/88 (BP Location: Right Arm)    Pulse 89    Temp 97.7 F (36.5 C) (Oral)    Resp 18    LMP 05/03/2021    SpO2 99%   Physical Exam Vitals and nursing note reviewed.  Constitutional:      Appearance: She is well-developed.  She is not ill-appearing.     Comments: ABCs intact  HENT:     Head: Normocephalic and atraumatic.     Comments: Slight swelling noted to the face symmetrically, with urticaria present    Nose: Nose normal.     Mouth/Throat:     Comments: No posterior oropharyngeal swelling or lip swelling noted, uvula midline Eyes:     Pupils: Pupils are equal, round, and reactive to light.  Cardiovascular:     Rate and Rhythm: Normal rate and regular rhythm.     Heart sounds: Normal heart sounds.  Pulmonary:     Effort: Pulmonary effort is normal. No respiratory distress.     Breath sounds: No wheezing.  Abdominal:     Palpations: Abdomen is soft.  Musculoskeletal:        General: No swelling.     Cervical back: Neck supple.  Skin:    General: Skin is warm and dry.     Comments: Diffuse urticaria noted about the face, trunk, extremities, spares palms and soles  Neurological:     Mental  Status: She is alert and oriented to person, place, and time.  Psychiatric:        Mood and Affect: Mood normal.    ED Results / Procedures / Treatments   Labs (all labs ordered are listed, but only abnormal results are displayed) Labs Reviewed - No data to display  EKG None  Radiology No results found.  Procedures Procedures   Medications Ordered in ED Medications  diphenhydrAMINE (BENADRYL) capsule 50 mg (50 mg Oral Given 05/20/21 0428)  famotidine (PEPCID) tablet 20 mg (20 mg Oral Given 05/20/21 0428)  predniSONE (DELTASONE) tablet 50 mg (50 mg Oral Given 05/20/21 0427)    ED Course  I have reviewed the triage vital signs and the nursing notes.  Pertinent labs & imaging results that were available during my care of the patient were reviewed by me and considered in my medical decision making (see chart for details).  Clinical Course as of 05/20/21 7035  Wynelle Link May 20, 2021  0093 Patient states she feels somewhat improved.  No worsening of rash noted.  Slight improvement of swelling of the face. [CH]    Clinical Course User Index [CH] Jordynn Marcella, Mayer Masker, MD   MDM Rules/Calculators/A&P                          Patient presents with a rash.  She is nontoxic and vital signs are reassuring.  Rash is most consistent with hives or urticaria.  It is itchy in nature.  Suggestive of IgE response.  No other systemic symptoms to indicate anaphylaxis.  Symptoms have been ongoing for the last 12 hours.  She is not had any upper respiratory symptoms to suggest relation to viral illness.  Patient given Benadryl, Pepcid, and prednisone.  See clinical course above.  She was observed.  No progression of rash.  Rash slightly defervesced while in the emergency department.  She had improvement of swelling of the face.  No evidence of angioedema.  We will continue to treat for allergic rash with prednisone, Benadryl, and Pepcid.  No indication for epinephrine at this time.  Patient advised if  symptoms worsened or she had signs or symptoms of anaphylaxis, she needed to return.  We will send home with an EpiPen.  After history, exam, and medical workup I feel the patient has been appropriately medically screened and is safe for discharge  home. Pertinent diagnoses were discussed with the patient. Patient was given return precautions.      Final Clinical Impression(s) / ED Diagnoses Final diagnoses:  Urticaria    Rx / DC Orders ED Discharge Orders          Ordered    diphenhydrAMINE (BENADRYL) 25 MG tablet  Every 6 hours PRN        05/20/21 0523    famotidine (PEPCID) 20 MG tablet  Daily        05/20/21 0523    predniSONE (DELTASONE) 20 MG tablet  Daily        05/20/21 0523             Shon Baton, MD 05/20/21 [phone]

## 2021-05-20 NOTE — Discharge Instructions (Addendum)
You were seen today for hives.  The cause of your symptoms is unknown.  Continue Benadryl, Pepcid, and prednisone at home.  If your symptoms worsen or you develop shortness of breath or throat swelling, you should be evaluated immediately.

## 2021-05-20 NOTE — ED Triage Notes (Signed)
Pt comes states that she broke out in a rash this afternoon, itchy, no SOB. Reports no new foods or soaps

## 2021-05-20 NOTE — ED Notes (Signed)
E-signature pad unavailable at time of pt discharge. This RN discussed discharge materials with pt and answered all pt questions. Pt stated understanding of discharge material. ? ?

## 2021-05-20 NOTE — ED Notes (Signed)
Pt c/o intense itching across her chest, under breasts, across/down her back, and down arms, bilaterally. This RN covered aforementioned areas with anti-itch lotion from supply room.

## 2021-11-15 ENCOUNTER — Encounter (HOSPITAL_COMMUNITY): Payer: Self-pay

## 2021-11-15 ENCOUNTER — Emergency Department (HOSPITAL_COMMUNITY)
Admission: EM | Admit: 2021-11-15 | Discharge: 2021-11-15 | Discharge status: Against Medical Advice | Payer: Medicaid Other | Attending: Emergency Medicine | Admitting: Emergency Medicine

## 2021-11-15 ENCOUNTER — Other Ambulatory Visit: Payer: Self-pay

## 2021-11-15 DIAGNOSIS — R109 Unspecified abdominal pain: Secondary | ICD-10-CM | POA: Insufficient documentation

## 2021-11-15 DIAGNOSIS — Z5321 Procedure and treatment not carried out due to patient leaving prior to being seen by health care provider: Secondary | ICD-10-CM | POA: Diagnosis not present

## 2021-11-15 DIAGNOSIS — R519 Headache, unspecified: Secondary | ICD-10-CM | POA: Diagnosis not present

## 2021-11-15 LAB — URINALYSIS, ROUTINE W REFLEX MICROSCOPIC
Bilirubin Urine: NEGATIVE
Glucose, UA: NEGATIVE mg/dL
Hgb urine dipstick: NEGATIVE
Ketones, ur: NEGATIVE mg/dL
Leukocytes,Ua: NEGATIVE
Nitrite: NEGATIVE
Protein, ur: NEGATIVE mg/dL
Specific Gravity, Urine: 1.033 — ABNORMAL HIGH (ref 1.005–1.030)
pH: 5 (ref 5.0–8.0)

## 2021-11-15 LAB — CBC
HCT: 41.2 % (ref 36.0–46.0)
Hemoglobin: 13.5 g/dL (ref 12.0–15.0)
MCH: 29.3 pg (ref 26.0–34.0)
MCHC: 32.8 g/dL (ref 30.0–36.0)
MCV: 89.4 fL (ref 80.0–100.0)
Platelets: 168 10*3/uL (ref 150–400)
RBC: 4.61 MIL/uL (ref 3.87–5.11)
RDW: 12.5 % (ref 11.5–15.5)
WBC: 6.2 10*3/uL (ref 4.0–10.5)
nRBC: 0 % (ref 0.0–0.2)

## 2021-11-15 LAB — COMPREHENSIVE METABOLIC PANEL
ALT: 26 U/L (ref 0–44)
AST: 26 U/L (ref 15–41)
Albumin: 3.7 g/dL (ref 3.5–5.0)
Alkaline Phosphatase: 72 U/L (ref 38–126)
Anion gap: 12 (ref 5–15)
BUN: 9 mg/dL (ref 6–20)
CO2: 21 mmol/L — ABNORMAL LOW (ref 22–32)
Calcium: 9.3 mg/dL (ref 8.9–10.3)
Chloride: 102 mmol/L (ref 98–111)
Creatinine, Ser: 0.75 mg/dL (ref 0.44–1.00)
GFR, Estimated: >60 mL/min (ref >60)
Glucose, Bld: 127 mg/dL — ABNORMAL HIGH (ref 70–99)
Potassium: 3.6 mmol/L (ref 3.5–5.1)
Sodium: 135 mmol/L (ref 135–145)
Total Bilirubin: 0.7 mg/dL (ref 0.3–1.2)
Total Protein: 7.3 g/dL (ref 6.5–8.1)

## 2021-11-15 LAB — I-STAT BETA HCG BLOOD, ED (MC, WL, AP ONLY): I-stat hCG, quantitative: <5 m[IU]/mL (ref <5)

## 2021-11-15 LAB — LIPASE, BLOOD: Lipase: 28 U/L (ref 11–51)

## 2021-11-15 NOTE — ED Notes (Signed)
Called for vitals X1 no answer

## 2021-11-15 NOTE — ED Triage Notes (Signed)
Pt c/o abdominal pain and headache x 1 week. Pt denies nausea, vomiting or diarrhea. Pt A&Ox4, NAD noted.

## 2021-11-15 NOTE — ED Notes (Signed)
Called no answer X2 for vitals  

## 2021-11-15 NOTE — ED Notes (Signed)
Called no answer X3 for vitals

## 2022-08-13 IMAGING — CT CT ABD-PELV W/ CM
2 of 5 series · 17 of 46 positions shown, 19 images · IV contrast (APPLIED)
Comparison: None.

CLINICAL DATA: RIGHT lower quadrant pain

EXAM:
CT ABDOMEN AND PELVIS WITH CONTRAST
TECHNIQUE: Multidetector CT imaging of the abdomen and pelvis was performed
using the standard protocol following bolus administration of
intravenous contrast.
CONTRAST:  100mL OMNIPAQUE IOHEXOL 300 MG/ML  SOLN

[Series 3: abd/ pelvis 5.0 i30f 2 · axial · 0.98mm/px · z∈[+811,+1241]mm · 14 of 96 slices shown, 16 images]
[im 5/96  soft-tissue]
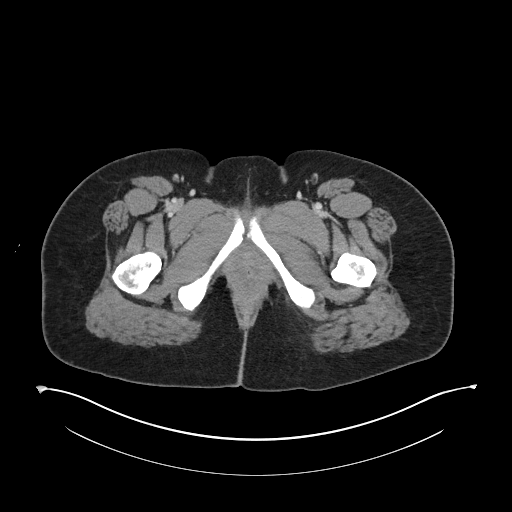
[im 5/96  bone]
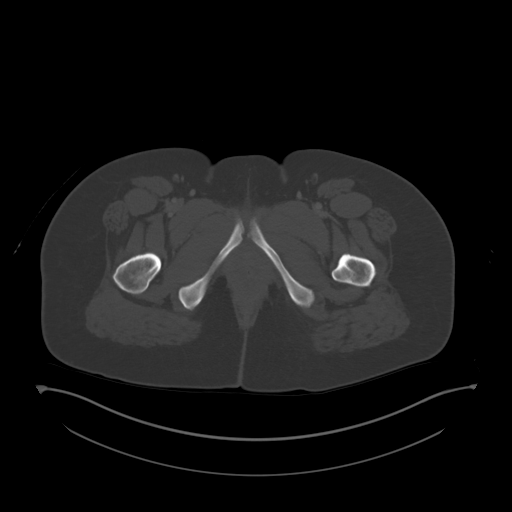
[im 15/96  soft-tissue]
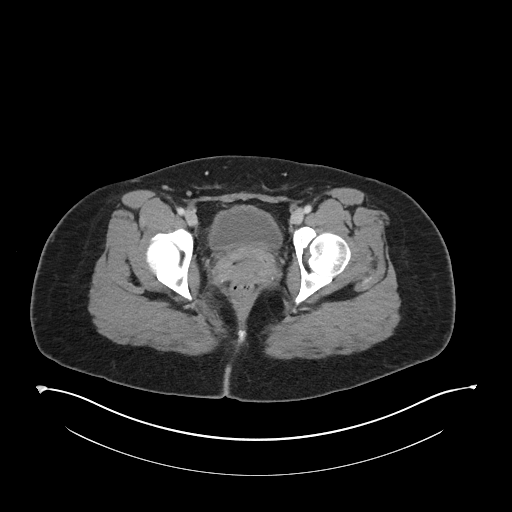
[im 20/96  soft-tissue]
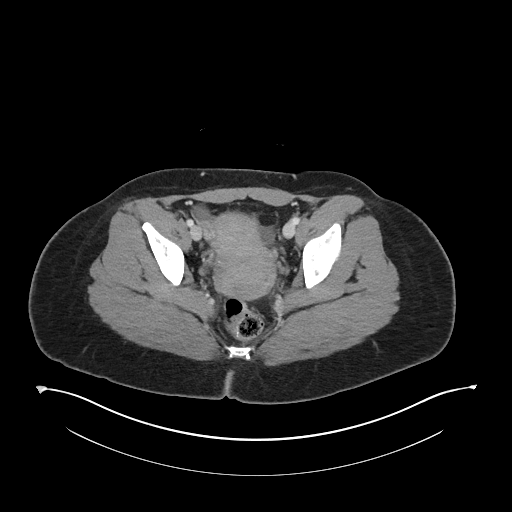
[im 24/96  soft-tissue]
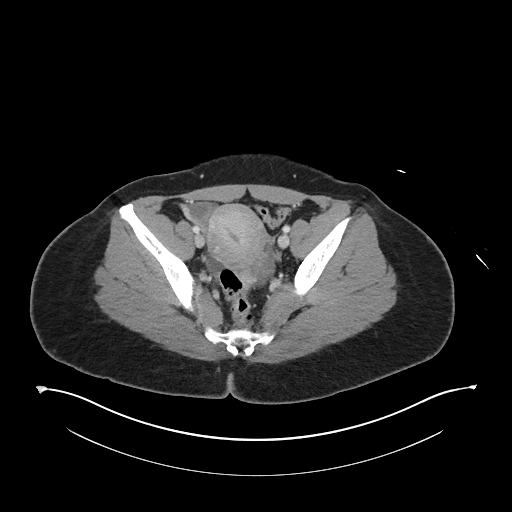
[im 34/96  soft-tissue]
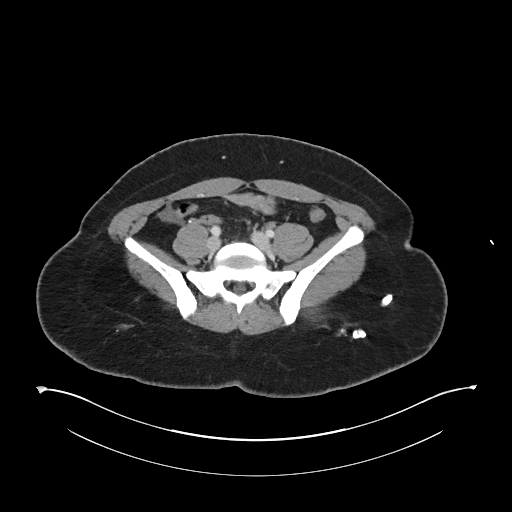
[im 39/96  soft-tissue]
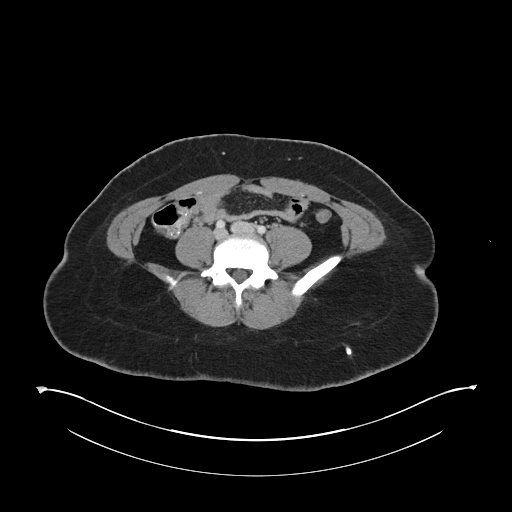
[im 43/96  soft-tissue]
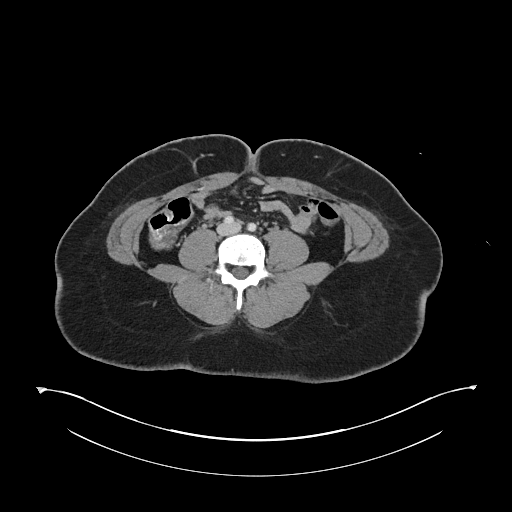
[im 53/96  soft-tissue]
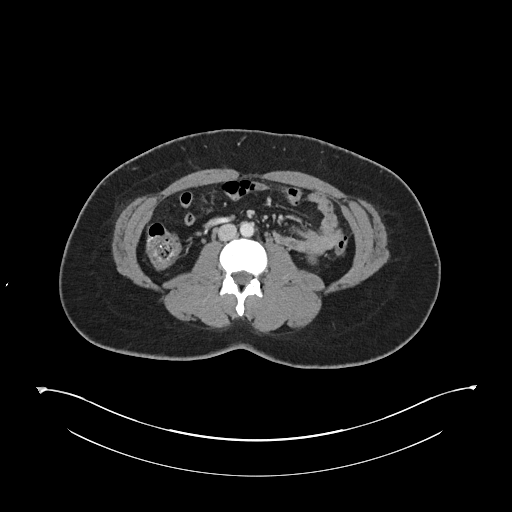
[im 58/96  soft-tissue]
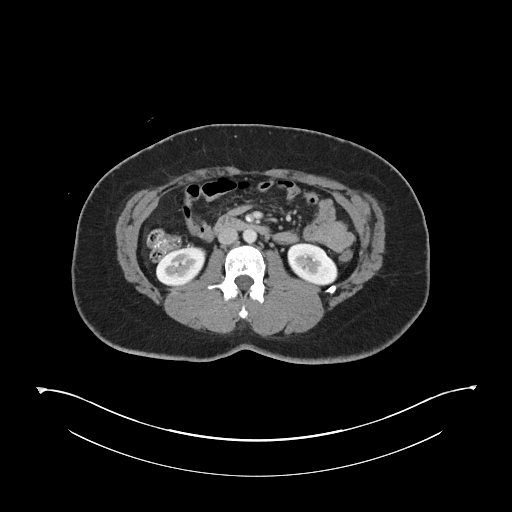
[im 58/96  bone]
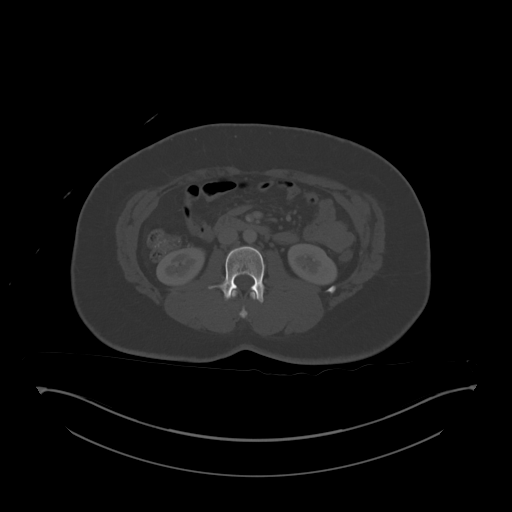
[im 62/96  soft-tissue]
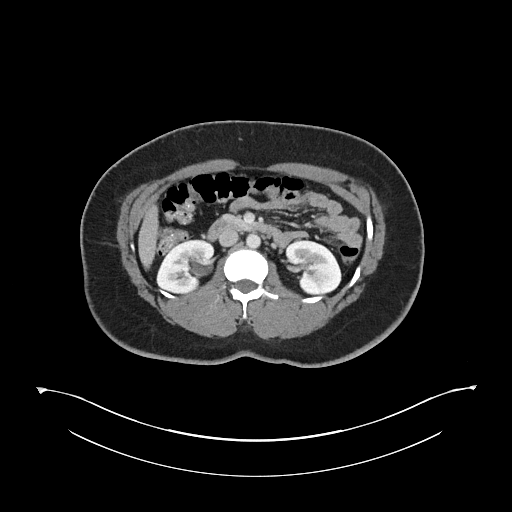
[im 72/96  soft-tissue]
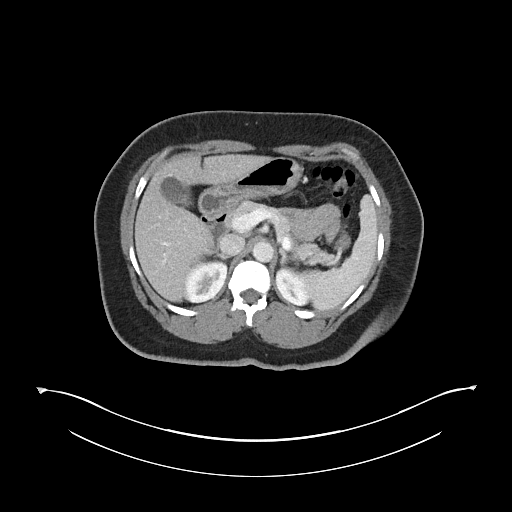
[im 77/96  soft-tissue]
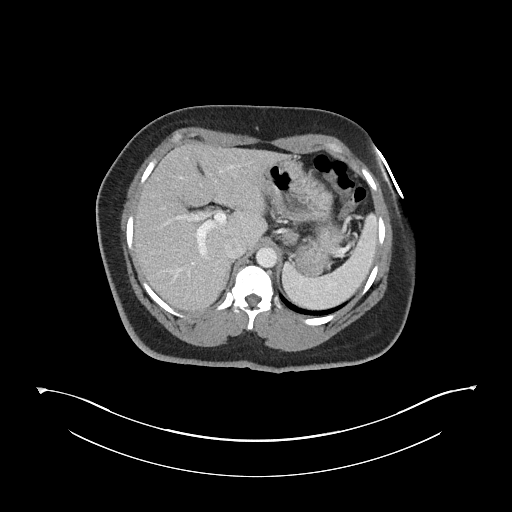
[im 81/96  soft-tissue]
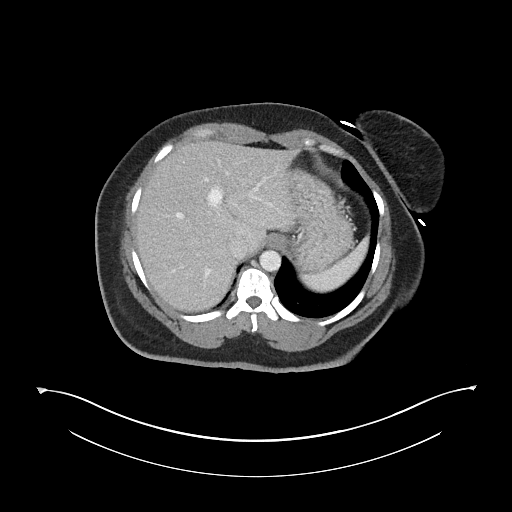
[im 91/96  soft-tissue]
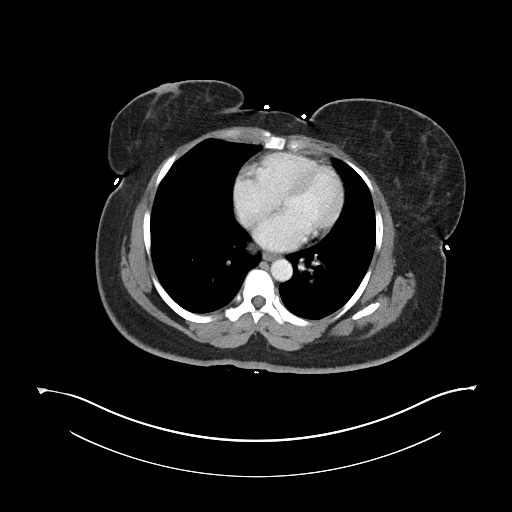

[Series 6: coronal soft tissue · coronal · 0.91mm/px · 3 of 98 slices shown]
[im 33/98  soft-tissue]
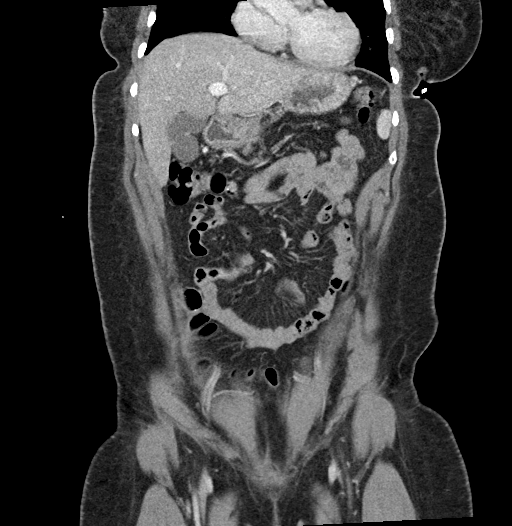
[im 44/98  soft-tissue]
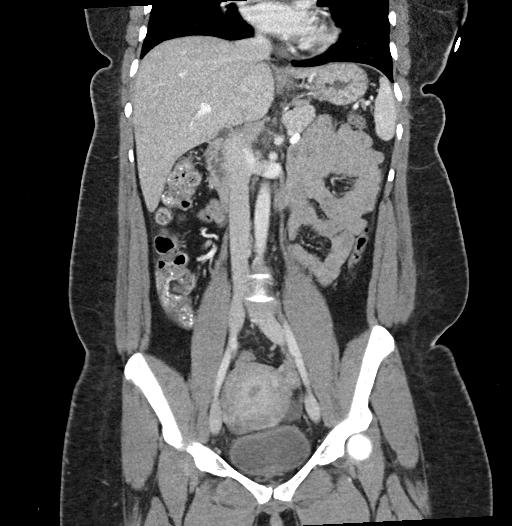
[im 54/98  soft-tissue]
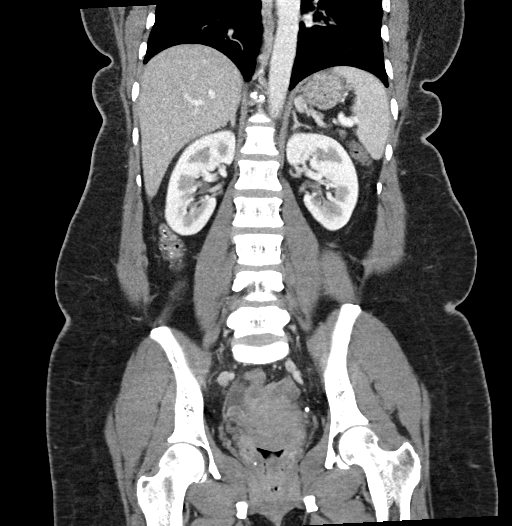

[17 of 46 positions shown; findings below may reference images not displayed]

FINDINGS: Lower chest: Lung bases are clear.

Hepatobiliary: No focal hepatic lesion. No biliary duct dilatation.
Common bile duct is normal.

Pancreas: Pancreas is normal. No ductal dilatation. No pancreatic
inflammation.

Spleen: Normal spleen

Adrenals/urinary tract: Adrenal glands and kidneys are normal. The
ureters and bladder normal.

Stomach/Bowel: Stomach, duodenum and small-bowel normal. Terminal
ileum is normal. Appendix not identified clearly. Trace amount of
free fluid at the tip of the cecum (image 64/3). Ascending,
transverse and descending colon normal rectosigmoid colon normal.

Vascular/Lymphatic: Abdominal aorta is normal caliber. No periportal
or retroperitoneal adenopathy. No pelvic adenopathy.

Reproductive: small free fluid posterior the uterus. There uterus
appears normal. Benign-appearing follicle of the RIGHT ovary
measures 21 mm. LEFT ovary normal.

Other: Trace free fluid in the pelvis RIGHT lower quadrant.

Musculoskeletal: No aggressive osseous lesion.
IMPRESSION: 1. Trace amount of free fluid at the tip of the cecum and associated
with the uterus and RIGHT adnexa. Favor physiologic fluid.
2. Appendix not identified but no secondary signs of appendicitis
other than small amount of free fluid at the tip of the cecum. This
fluid may be associated with the ovary.
3.

## 2022-08-14 ENCOUNTER — Other Ambulatory Visit: Payer: Self-pay | Admitting: Physician Assistant

## 2022-08-14 DIAGNOSIS — Z1231 Encounter for screening mammogram for malignant neoplasm of breast: Secondary | ICD-10-CM
